# Patient Record
Sex: Female | Born: 1990 | Race: White | Hispanic: No | Marital: Single | Smoking: Never smoker
Health system: Southern US, Community
[De-identification: ages and names within clinical notes are randomized; demographics above are authoritative.]

---

## 2020-04-22 ENCOUNTER — Other Ambulatory Visit (HOSPITAL_COMMUNITY)
Admission: RE | Admit: 2020-04-22 | Discharge: 2020-04-22 | Disposition: A | Discharge status: Home / Self Care | Payer: 59 | Source: Ambulatory Visit | Attending: Family Medicine | Admitting: Family Medicine

## 2020-04-22 ENCOUNTER — Other Ambulatory Visit: Payer: Self-pay | Admitting: Physician Assistant

## 2020-04-22 ENCOUNTER — Ambulatory Visit (INDEPENDENT_AMBULATORY_CARE_PROVIDER_SITE_OTHER): Payer: Self-pay

## 2020-04-22 ENCOUNTER — Other Ambulatory Visit: Payer: Self-pay

## 2020-04-22 ENCOUNTER — Emergency Department (INDEPENDENT_AMBULATORY_CARE_PROVIDER_SITE_OTHER)
Admission: EM | Admit: 2020-04-22 | Discharge: 2020-04-22 | Disposition: A | Discharge status: Home / Self Care | Payer: Self-pay | Source: Home / Self Care

## 2020-04-22 DIAGNOSIS — R350 Frequency of micturition: Secondary | ICD-10-CM

## 2020-04-22 DIAGNOSIS — N898 Other specified noninflammatory disorders of vagina: Secondary | ICD-10-CM | POA: Diagnosis not present

## 2020-04-22 DIAGNOSIS — Z021 Encounter for pre-employment examination: Secondary | ICD-10-CM

## 2020-04-22 LAB — POCT URINALYSIS DIP (MANUAL ENTRY)
Bilirubin, UA: NEGATIVE
Blood, UA: NEGATIVE
Glucose, UA: NEGATIVE mg/dL
Ketones, POC UA: NEGATIVE mg/dL
Leukocytes, UA: NEGATIVE
Nitrite, UA: NEGATIVE
Protein Ur, POC: NEGATIVE mg/dL
Spec Grav, UA: 1.02 (ref 1.010–1.025)
Urobilinogen, UA: 0.2 E.U./dL
pH, UA: 7 (ref 5.0–8.0)

## 2020-04-22 NOTE — ED Triage Notes (Signed)
Patient presents to Urgent Care with complaints of vaginal discharge and frequent urination since almost a week ago. Patient reports she has not tried anything over the counter for her sx.

## 2020-04-22 NOTE — Discharge Instructions (Signed)
  You may try over the counter medication called Azo to help with bladder spasms.  This medication can make your urine orange, which is normal.  Be sure to stay well hydrated, limit caffeine and alcohol use. Limit salt intake.  Please follow up with your family doctor or OB/GYN if not improving in 1 week, sooner if symptoms worsening- develop pain, fever, nausea, vomiting, or other new concerning symptoms develop.

## 2020-04-22 NOTE — ED Provider Notes (Signed)
[facility]    CSN: [id number] Arrival date & time: 04/22/20  1528      History   Chief Complaint Chief Complaint  Patient presents with  . Urinary Tract Infection    HPI Shannen Flansburg is a 29 y.o. female.   HPI  Malena Timpone is a 29 y.o. female presenting to UC with c/o urinary frequency and vaginal discharge for about 1 week. Denies pain with urination, hematuria, or vaginal pain or bleeding. Denies abdominal pain, fever, chills, n/v/d, or back pain. No hx of diabetes. No change in medications or drinking or dietary changes. No concern for STIs.  No concern for pregnancy, she has an IUD.    History reviewed. No pertinent past medical history.  There are no problems to display for this patient.   History reviewed. No pertinent surgical history.  OB History   No obstetric history on file.      Home Medications    Prior to Admission medications   Medication Sig Start Date End Date Taking? Authorizing Provider  venlafaxine (EFFEXOR) 100 MG tablet Take 150 mg by mouth daily.   Yes [provider]    Family History Family History  Problem Relation Age of Onset  . Hypertension Mother   . Hypertension Father   . Heart failure Father   . Diabetes Father     Social History Social History   Tobacco Use  . Smoking status: Never Smoker  . Smokeless tobacco: Never Used  Substance Use Topics  . Alcohol use: Yes    Comment: socially  . Drug use: Not on file     Allergies   Patient has no known allergies.   Review of Systems Review of Systems  Constitutional: Negative for chills and fever.  Gastrointestinal: Negative for abdominal pain, diarrhea, nausea and vomiting.  Endocrine: Positive for polyuria. Negative for polydipsia and polyphagia.  Genitourinary: Positive for frequency and vaginal discharge. Negative for dysuria, flank pain, hematuria, vaginal bleeding and vaginal pain.  Neurological: Negative for dizziness,  light-headedness and headaches.     Physical Exam Triage Vital Signs ED Triage Vitals  Enc Vitals Group     BP 04/22/20 1547 101/66     Pulse Rate 04/22/20 1547 77     Resp 04/22/20 1547 16     Temp 04/22/20 1547 98.2 F (36.8 C)     Temp Source 04/22/20 1547 Oral     SpO2 04/22/20 1547 100 %     Weight --      Height --      Head Circumference --      Peak Flow --      Pain Score 04/22/20 1543 2     Pain Loc --      Pain Edu? --      Excl. in [place]? --    No data found.  Updated Vital Signs BP 101/66 (BP Location: Right Arm)   Pulse 77   Temp 98.2 F (36.8 C) (Oral)   Resp 16   LMP  (LMP Unknown) Comment: IUD placed 2020  SpO2 100%   Visual Acuity Right Eye Distance:   Left Eye Distance:   Bilateral Distance:    Right Eye Near:   Left Eye Near:    Bilateral Near:     Physical Exam Vitals and nursing note reviewed.  Constitutional:      General: She is not in acute distress.    Appearance: Normal appearance. She is well-developed. She is not ill-appearing, toxic-appearing  or diaphoretic.  HENT:     Head: Normocephalic and atraumatic.  Cardiovascular:     Rate and Rhythm: Normal rate and regular rhythm.  Pulmonary:     Effort: Pulmonary effort is normal. No respiratory distress.     Breath sounds: Normal breath sounds.  Abdominal:     General: There is no distension.     Palpations: Abdomen is soft.     Tenderness: There is no abdominal tenderness. There is no right CVA tenderness or left CVA tenderness.  Musculoskeletal:        General: Normal range of motion.     Cervical back: Normal range of motion.  Skin:    General: Skin is warm and dry.  Neurological:     Mental Status: She is alert and oriented to person, place, and time.  Psychiatric:        Behavior: Behavior normal.      UC Treatments / Results  Labs (all labs ordered are listed, but only abnormal results are displayed) Labs Reviewed  POCT URINALYSIS DIP (MANUAL ENTRY)  POCT FASTING  CBG [facility] MANUAL ENTRY  CERVICOVAGINAL ANCILLARY ONLY    EKG   Radiology   Procedures Procedures (including critical care time)  Medications Ordered in UC Medications - No data to display  Initial Impression / Assessment and Plan / UC Course  I have reviewed the triage vital signs and the nursing notes.  Pertinent labs & imaging results that were available during my care of the patient were reviewed by me and considered in my medical decision making (see chart for details).     UA: unremarkable No evidence of UTI  CBG: WNL, frequency does not appear to be due to hyperglycemia.  Vaginal selfswab performed to check for BV/yeast.  Recommend pt try Azo for urinary symptoms Encouraged f/u with PCP or GYN if not improving within 1 week, sooner if new/worsening symptoms develop.  AVS provided.  Final Clinical Impressions(s) / UC Diagnoses   Final diagnoses:  Urinary frequency  Vaginal discharge     Discharge Instructions      You may try over the counter medication called Azo to help with bladder spasms.  This medication can make your urine orange, which is normal.  Be sure to stay well hydrated, limit caffeine and alcohol use. Limit salt intake.  Please follow up with your family doctor or OB/GYN if not improving in 1 week, sooner if symptoms worsening- develop pain, fever, nausea, vomiting, or other new concerning symptoms develop.    ED Prescriptions    None     PDMP not reviewed this encounter.   Lurene Shadow, [place] 04/23/20 1014

## 2020-04-23 LAB — CERVICOVAGINAL ANCILLARY ONLY
Bacterial Vaginitis (gardnerella): NEGATIVE
Candida Glabrata: NEGATIVE
Candida Vaginitis: NEGATIVE
Comment: NEGATIVE
Comment: NEGATIVE
Comment: NEGATIVE

## 2020-04-28 ENCOUNTER — Telehealth: Payer: Self-pay | Admitting: Emergency Medicine

## 2020-04-28 NOTE — Telephone Encounter (Signed)
Returning patient's phone call, informed of negative lab results.  Patient voices understanding.

## 2021-11-01 IMAGING — DX DG CHEST 1V
1 series · 1 of 1 positions shown · non-contrast
Comparison: None.

CLINICAL DATA: Pre-employment physical examination

EXAM:
CHEST  1 VIEW

[chest pa]
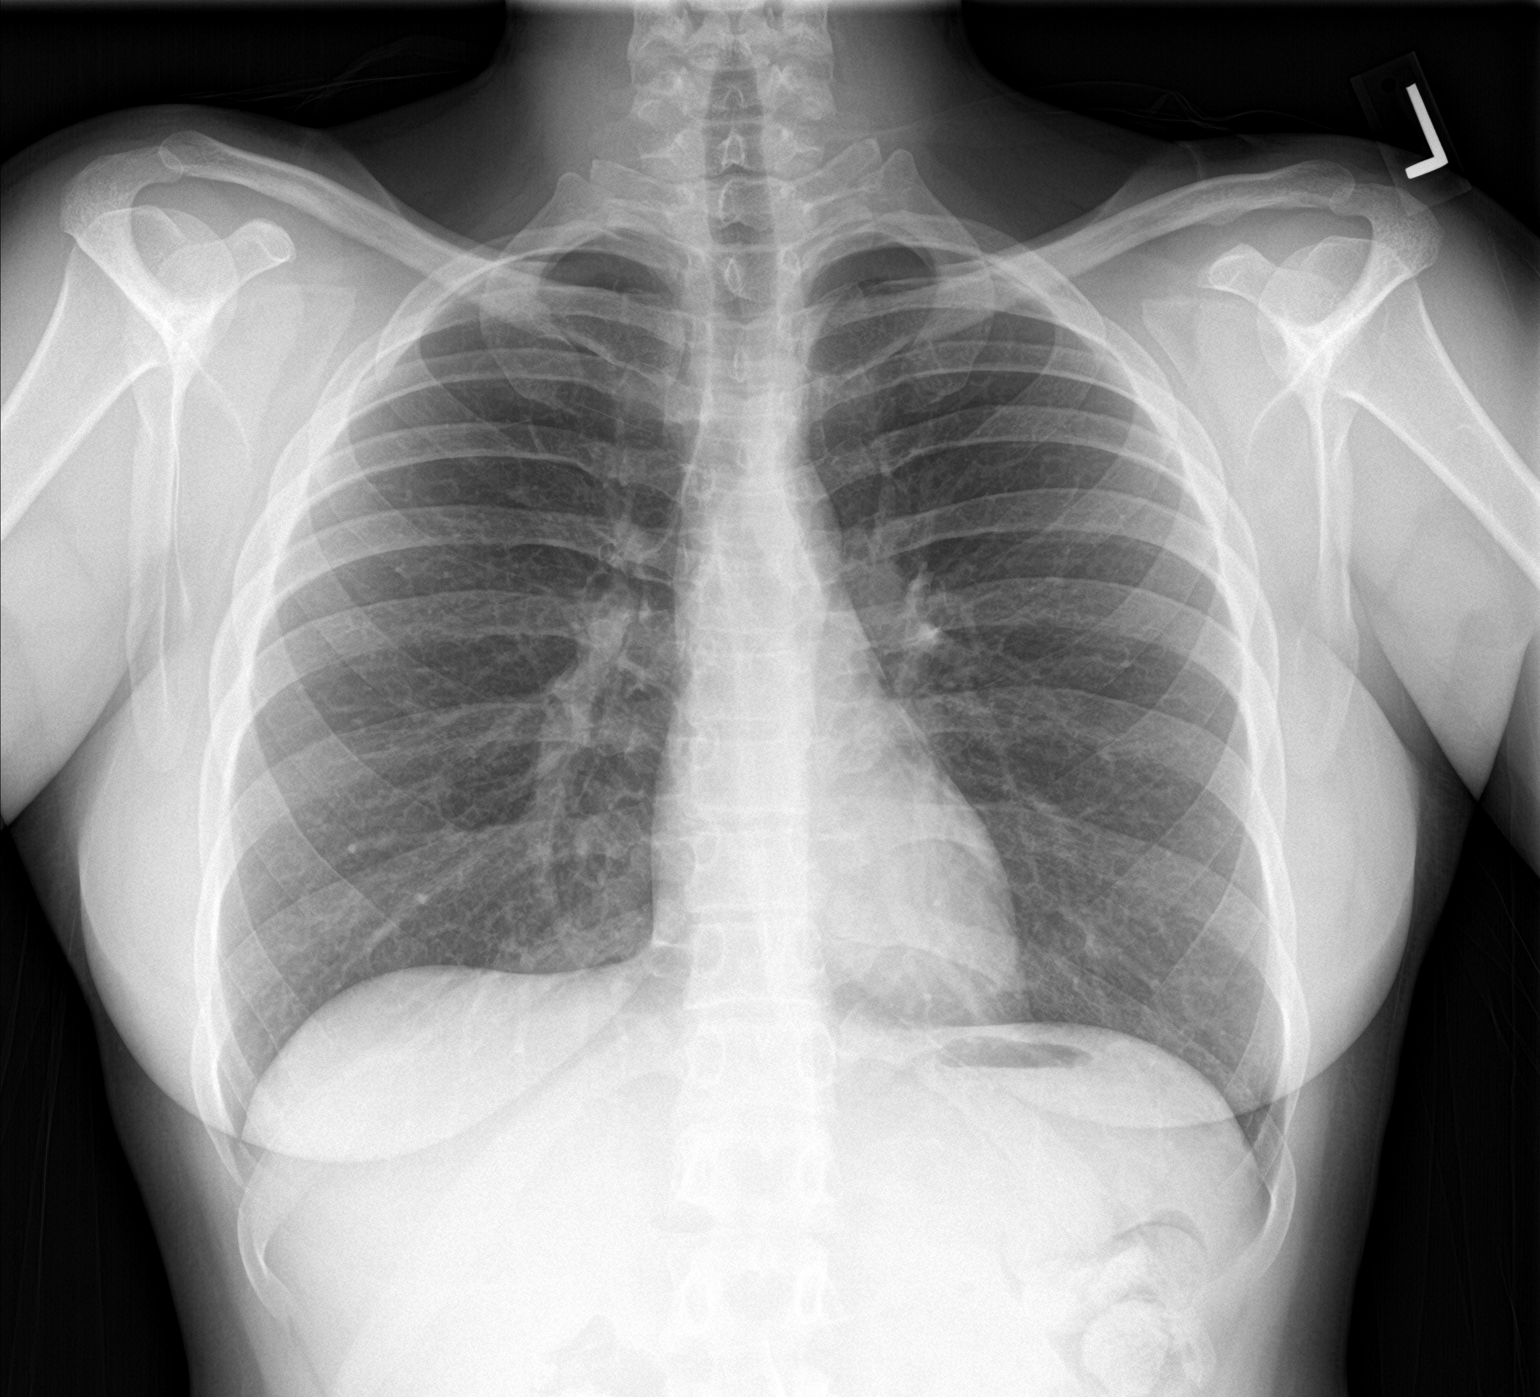

[1 of 1 positions shown; findings below may reference images not displayed]

FINDINGS: Lungs are clear. Heart size and pulmonary vascularity are normal. No
adenopathy. No bone lesions.
IMPRESSION: No abnormality noted.
